# Patient Record
Sex: Male | Born: 1960 | Race: White | Hispanic: No | Marital: Married | Smoking: Never smoker
Health system: Southern US, Community
[De-identification: ages and names within clinical notes are randomized; demographics above are authoritative.]

## PROBLEM LIST (undated history)

## (undated) DIAGNOSIS — F419 Anxiety disorder, unspecified: Secondary | ICD-10-CM

## (undated) DIAGNOSIS — I10 Essential (primary) hypertension: Secondary | ICD-10-CM

## (undated) DIAGNOSIS — G4730 Sleep apnea, unspecified: Secondary | ICD-10-CM

## (undated) HISTORY — PX: WRIST FUSION: SHX839

## (undated) HISTORY — PX: JOINT REPLACEMENT: SHX530

---

## 2013-11-21 ENCOUNTER — Inpatient Hospital Stay (HOSPITAL_COMMUNITY)
Admission: EM | Admit: 2013-11-21 | Discharge: 2013-11-25 | DRG: 684 | Disposition: A | Discharge status: Home / Self Care | Payer: BC Managed Care – PPO | Attending: Internal Medicine | Admitting: Internal Medicine

## 2013-11-21 ENCOUNTER — Encounter (HOSPITAL_COMMUNITY): Payer: Self-pay | Admitting: Emergency Medicine

## 2013-11-21 DIAGNOSIS — F1020 Alcohol dependence, uncomplicated: Secondary | ICD-10-CM

## 2013-11-21 DIAGNOSIS — Z79899 Other long term (current) drug therapy: Secondary | ICD-10-CM

## 2013-11-21 DIAGNOSIS — F17200 Nicotine dependence, unspecified, uncomplicated: Secondary | ICD-10-CM | POA: Diagnosis present

## 2013-11-21 DIAGNOSIS — Z96659 Presence of unspecified artificial knee joint: Secondary | ICD-10-CM

## 2013-11-21 DIAGNOSIS — K5289 Other specified noninfective gastroenteritis and colitis: Secondary | ICD-10-CM | POA: Diagnosis present

## 2013-11-21 DIAGNOSIS — I10 Essential (primary) hypertension: Secondary | ICD-10-CM

## 2013-11-21 DIAGNOSIS — E860 Dehydration: Secondary | ICD-10-CM | POA: Diagnosis present

## 2013-11-21 DIAGNOSIS — F1320 Sedative, hypnotic or anxiolytic dependence, uncomplicated: Secondary | ICD-10-CM

## 2013-11-21 DIAGNOSIS — G4730 Sleep apnea, unspecified: Secondary | ICD-10-CM | POA: Diagnosis present

## 2013-11-21 DIAGNOSIS — N179 Acute kidney failure, unspecified: Principal | ICD-10-CM

## 2013-11-21 HISTORY — DX: Sleep apnea, unspecified: G47.30

## 2013-11-21 HISTORY — DX: Essential (primary) hypertension: I10

## 2013-11-21 HISTORY — DX: Anxiety disorder, unspecified: F41.9

## 2013-11-21 LAB — CBC
HCT: 37.7 % — ABNORMAL LOW (ref 39.0–52.0)
HEMOGLOBIN: 13.4 g/dL (ref 13.0–17.0)
MCH: 30.1 pg (ref 26.0–34.0)
MCHC: 35.5 g/dL (ref 30.0–36.0)
MCV: 84.7 fL (ref 78.0–100.0)
Platelets: 276 10*3/uL (ref 150–400)
RBC: 4.45 MIL/uL (ref 4.22–5.81)
RDW: 12.5 % (ref 11.5–15.5)
WBC: 10.2 10*3/uL (ref 4.0–10.5)

## 2013-11-21 LAB — URINALYSIS, ROUTINE W REFLEX MICROSCOPIC
Bilirubin Urine: NEGATIVE
GLUCOSE, UA: NEGATIVE mg/dL
Hgb urine dipstick: NEGATIVE
Ketones, ur: NEGATIVE mg/dL
LEUKOCYTES UA: NEGATIVE
Nitrite: NEGATIVE
PH: 5.5 (ref 5.0–8.0)
Protein, ur: NEGATIVE mg/dL
Specific Gravity, Urine: 1.011 (ref 1.005–1.030)
Urobilinogen, UA: 0.2 mg/dL (ref 0.0–1.0)

## 2013-11-21 LAB — BASIC METABOLIC PANEL
BUN: 47 mg/dL — ABNORMAL HIGH (ref 6–23)
CALCIUM: 9.2 mg/dL (ref 8.4–10.5)
CO2: 24 meq/L (ref 19–32)
Chloride: 96 mEq/L (ref 96–112)
Creatinine, Ser: 6.01 mg/dL — ABNORMAL HIGH (ref 0.50–1.35)
GFR calc Af Amer: 11 mL/min — ABNORMAL LOW (ref >90)
GFR calc non Af Amer: 10 mL/min — ABNORMAL LOW (ref >90)
GLUCOSE: 89 mg/dL (ref 70–99)
Potassium: 3.8 mEq/L (ref 3.7–5.3)
SODIUM: 139 meq/L (ref 137–147)

## 2013-11-21 MED ORDER — SODIUM CHLORIDE 0.9 % IV BOLUS (SEPSIS)
1000.0000 mL | Freq: Once | INTRAVENOUS | Status: AC
  Administered 2013-11-21: 1000 mL via INTRAVENOUS

## 2013-11-21 MED ORDER — SODIUM CHLORIDE 0.9 % IV SOLN
INTRAVENOUS | Status: AC
  Administered 2013-11-21: 1000 mL via INTRAVENOUS
  Administered 2013-11-22 – 2013-11-24 (×3): via INTRAVENOUS

## 2013-11-21 MED ORDER — TRAZODONE HCL 50 MG PO TABS
50.0000 mg | ORAL_TABLET | Freq: Every day | ORAL | Status: DC
  Administered 2013-11-21: 50 mg via ORAL
  Filled 2013-11-21 (×5): qty 1

## 2013-11-21 NOTE — H&P (Addendum)
Patient Demographics  Rick Wells, is a 53 y.o. male  MRN: [id number]   DOB - 06/30/1961  Admit Date - 11/21/2013  Outpatient Primary MD for the patient is No primary provider on file.  Assessment  Rick Wells a very pleasant 53 year old male with essential hypertension as well as Alcohol, Tobacco, Ambien and Benzodiazepine dependence, who comes in as a [facility] for an elevated creatinine level reportedly above 7. The high creatinine was confirmed in the ED,where it was found to be 6.01, with a BUN of 47. The cause of the apparent acute kidney injury is not known at this point. However, the patient gives history of acute abdominal pain in the epigastric area, which went on for about 3 days earlier this week. Patient also had diarrhea that started at the same time as the abdominal pain, with at least 2-3 bowel movements per day till yesterday. He has also been taking losartan for hypertension. His blood pressure was high in the ED. [facility] from Benzodiazepines and Ambien. He mentions that he started cutting back on the medications earlier in the week in preparation for the admission [facility]. He denies sick contacts or recreational drug use prior to this episode. The acute abdominal pain may be related to the acute kidney injury. The patient will be admitted for further workup. Will avoid  nephrotoxic medications including ARB/ACE inhibitor. Meanwhile, will give IV fluids, obtain CT abdomen and pelvis without IV contrast to try to identify a source of the acute abdominal pain, renal ultrasound to evaluate kidneys and check for hydronephrosis, and will also obtain urine electrolytes. Consider Nephrology for help with further assessment and management. Regarding detox, will defer management to [facility] once the acute medical issues have been addressed. Plan AKI (acute kidney injury)  Admit  telemetry  Urine electrolytes  Renal ultrasound  CT abdomen and pelvis  Avoid nephrotoxic medications  IV fluids  Monitor input and output  Consider nephrology consult depending on investigations and clinical progression. EtOH dependence/Benzodiazepine dependence/Tobacco dependence  Check for withdrawal  Supportive care as necessary  Folic acid  Thiamine  Urine toxicology screen  Consult clinical social worker Essential HTN (hypertension)   Discontinue ARB/diuretic  Lopressor  Hydralazine as needed  DVT/GI Prophylaxis   Heparin   PPI  AM Labs Ordered, also please review Full Orders  Family Communication: Admission, patients condition and plan of care including tests being ordered have been discussed with the patient who indicated understanding and agree with the plan and Code Status.  Code Status   Full Code  Likely DC to    [facility]  Condition GUARDED    HPI Rick Wells  is a 53 y.o. male, comes seen with abnormal lab tests. He had [facility] as routine lab investigation and his creatinine was reportedly greater than 7 when it was normal 3 months ago. He was otherwise in good health today. He reports that he had acute abdominal pain about 5 days call associated with diarrhea 2-3 times a day. He has continued to take his blood pressure medications. He denies fever, nausea, cough or dysuria.  Review of Systems As in the HPI above.   Past Medical History  Diagnosis Date  . Hypertension       Past Surgical History  Procedure Laterality Date  . Wrist fusion    . Joint replacement  knee left    in for   Chief Complaint  Patient presents with  . Abnormal Lab      Social History History  Substance Use Topics  . Smoking status: Never Smoker   . Smokeless tobacco: Not on file  . Alcohol Use: Yes   Family History Mother has heart disease and father is diabetic.  Prior to Admission  medications   Medication Sig Start Date End Date Taking? Authorizing Provider  chlordiazePOXIDE (LIBRIUM) 25 MG capsule Take 25 mg by mouth 4 (four) times daily.   Yes Historical Provider, MD  losartan-hydrochlorothiazide (HYZAAR) 100-12.5 MG per tablet Take 1 tablet by mouth daily.   Yes Historical Provider, MD  Multiple Vitamin (MULTIVITAMIN WITH MINERALS) TABS tablet Take 1 tablet by mouth daily.   Yes Historical Provider, MD  nicotine polacrilex (NICORETTE) 4 MG gum Take 4 mg by mouth 2 (two) times daily.   Yes Historical Provider, MD  Omega-3 Fatty Acids (FISH OIL) 1200 MG CAPS Take 1,200 mg by mouth 2 (two) times daily.   Yes Historical Provider, MD  PSYLLIUM HUSK PO Take 1,000 mg by mouth 2 (two) times daily.   Yes Historical Provider, MD  thiamine (VITAMIN B-1) 100 MG tablet Take 100 mg by mouth daily.   Yes Historical Provider, MD  traZODone (DESYREL) 50 MG tablet Take 50 mg by mouth at bedtime.   Yes Historical Provider, MD    No Known Allergies  Physical Exam  Vitals  Blood pressure 174/111, pulse 84, temperature 99 F (37.2 C), temperature source Oral, resp. rate 16, height 5\' 10"  (1.778 m), weight 90.719 kg (200 lb), SpO2 99.00%.   1. General lying in bed in NAD.  2. Normal affect and insight, Not Suicidal or Homicidal, Awake Alert, Oriented X 3.  3. No F.N deficits, ALL C.Nerves Intact, Strength 5/5 all 4 extremities, Sensation intact all 4 extremities, Plantars down going.  4. Ears and Eyes appear Normal, Conjunctivae clear, PERRLA. Moist Oral Mucosa.  5. Supple Neck, No JVD, No cervical lymphadenopathy appriciated, No Carotid Bruits.  6. Symmetrical Chest wall movement, Good air movement bilaterally, CTAB.  7. RRR, No Gallops, Rubs or Murmurs, No Parasternal Heave.  8. Positive Bowel Sounds, Abdomen Soft, Non tender, No organomegaly appriciated,No rebound -guarding or rigidity.  9.  No Cyanosis, Normal Skin Turgor, No Skin Rash or Bruise.  10. Good muscle tone,   joints appear normal , no effusions, Normal ROM.  11. No Palpable Lymph Nodes in Neck or Axillae  Data Review  CBC  Recent Labs Lab 11/21/13 2040  WBC 10.2  HGB 13.4  HCT 37.7*  PLT 276  MCV 84.7  MCH 30.1  MCHC 35.5  RDW 12.5   ------------------------------------------------------------------------------------------------------------------  Chemistries   Recent Labs Lab 11/21/13 2040  NA 139  K 3.8  CL 96  CO2 24  GLUCOSE 89  BUN 47*  CREATININE 6.01*  CALCIUM 9.2   ------------------------------------------------------------------------------------------------------------------ estimated creatinine clearance is 16.3 ml/min (by C-G formula based on Cr of 6.01). ------------------------------------------------------------------------------------------------------------------ No results found for this basename: TSH, T4TOTAL, FREET3, T3FREE, THYROIDAB,  in the last 72 hours   Coagulation profile No results found for this basename: INR, PROTIME,  in the last 168 hours ------------------------------------------------------------------------------------------------------------------- No results found for this basename: DDIMER,  in the last 72 hours -------------------------------------------------------------------------------------------------------------------  Cardiac Enzymes No results found for this basename: CK, CKMB, TROPONINI, MYOGLOBIN,  in the last 168 hours ------------------------------------------------------------------------------------------------------------------ No components found with this basename: POCBNP,    ---------------------------------------------------------------------------------------------------------------  Urinalysis    Component Value Date/Time   COLORURINE YELLOW 11/21/2013 2043   APPEARANCEUR CLEAR 11/21/2013 2043   LABSPEC 1.011 11/21/2013 2043   PHURINE 5.5 11/21/2013 2043   GLUCOSEU NEGATIVE 11/21/2013 2043   HGBUR NEGATIVE  11/21/2013 2043   BILIRUBINUR NEGATIVE 11/21/2013 2043   KETONESUR NEGATIVE 11/21/2013 2043   PROTEINUR NEGATIVE 11/21/2013 2043   UROBILINOGEN 0.2 11/21/2013 2043   NITRITE NEGATIVE 11/21/2013 2043   LEUKOCYTESUR NEGATIVE 11/21/2013 2043    ----------------------------------------------------------------------------------------------------------------  Imaging results:   No results found.      Jolinda Pinkstaff M.D on 11/21/2013 at 11:26 PM  Between 7am to 7pm - Pager - [phone]  After 7pm go to [email or web address] - password [email or web address]  And look for the night coverage person covering me after hours  [facility] Office  [phone]

## 2013-11-21 NOTE — ED Notes (Signed)
[facility]

## 2013-11-21 NOTE — ED Notes (Signed)
Admitting MD at bedside.

## 2013-11-21 NOTE — ED Provider Notes (Signed)
TIME SEEN: 8:30 PM  CHIEF COMPLAINT: Abnormal labs, acute renal failure  HPI: Patient is a 53 year old male with history of alcohol and benzodiazepine abuse, hypertension on losartan hydrochlorothiazide he presents emergency department [facility] with abnormal labs drawn today. Patient reports he he had routine labs drawn today and was found to have a creatinine of 7.71. He denies any changes in his medications. Denies any vomiting or diarrhea. He has been eating and drinking well. He states he last had blood drawn by his [facility] 3 months ago that were normal. He states he's never had a history of kidney failure or ever been told he needed dialysis in the future. He denies any flank pain. No difficulty urinating. No fevers. No chest pain or shortness of breath.  ROS: See HPI Constitutional: no fever  Eyes: no drainage  ENT: no runny nose   Cardiovascular:  no chest pain  Resp: no SOB  GI: no vomiting GU: no dysuria Integumentary: no rash  Allergy: no hives  Musculoskeletal: no leg swelling  Neurological: no slurred speech ROS otherwise negative  PAST MEDICAL HISTORY/PAST SURGICAL HISTORY:  Past Medical History  Diagnosis Date  . Hypertension     MEDICATIONS:  Prior to Admission medications   Medication Sig Start Date End Date Taking? Authorizing Provider  chlordiazePOXIDE (LIBRIUM) 25 MG capsule Take 25 mg by mouth 4 (four) times daily.   Yes Historical Provider, MD  losartan-hydrochlorothiazide (HYZAAR) 100-12.5 MG per tablet Take 1 tablet by mouth daily.   Yes Historical Provider, MD  Multiple Vitamin (MULTIVITAMIN WITH MINERALS) TABS tablet Take 1 tablet by mouth daily.   Yes Historical Provider, MD  nicotine polacrilex (NICORETTE) 4 MG gum Take 4 mg by mouth 2 (two) times daily.   Yes Historical Provider, MD  Omega-3 Fatty Acids (FISH OIL) 1200 MG CAPS Take 1,200 mg by mouth 2 (two) times daily.   Yes  Historical Provider, MD  PSYLLIUM HUSK PO Take 1,000 mg by mouth 2 (two) times daily.   Yes Historical Provider, MD  thiamine (VITAMIN B-1) 100 MG tablet Take 100 mg by mouth daily.   Yes Historical Provider, MD  traZODone (DESYREL) 50 MG tablet Take 50 mg by mouth at bedtime.   Yes Historical Provider, MD    ALLERGIES:  No Known Allergies  SOCIAL HISTORY:  History  Substance Use Topics  . Smoking status: Never Smoker   . Smokeless tobacco: Not on file  . Alcohol Use: Yes    FAMILY HISTORY: No family history on file.  EXAM: BP 154/101  Pulse 72  Temp(Src) 99 F (37.2 C) (Oral)  Resp 16  Ht 5\' 10"  (1.778 m)  Wt 200 lb (90.719 kg)  BMI 28.70 kg/m2  SpO2 97% CONSTITUTIONAL: Alert and oriented and responds appropriately to questions. Well-appearing; well-nourished HEAD: Normocephalic EYES: Conjunctivae clear, PERRL ENT: normal nose; no rhinorrhea; moist mucous membranes; pharynx without lesions noted NECK: Supple, no meningismus, no LAD  CARD: RRR; S1 and S2 appreciated; no murmurs, no clicks, no rubs, no gallops RESP: Normal chest excursion without splinting or tachypnea; breath sounds clear and equal bilaterally; no wheezes, no rhonchi, no rales,  ABD/GI: Normal bowel sounds; non-distended; soft, non-tender, no rebound, no guarding BACK:  The back appears normal and is non-tender to palpation, there is no CVA tenderness EXT: Normal ROM in all joints; non-tender to palpation; no edema; normal capillary refill; no cyanosis    SKIN: Normal color  for age and race; warm NEURO: Moves all extremities equally PSYCH: The patient's mood and manner are appropriate. Grooming and personal hygiene are appropriate.  MEDICAL DECISION MAKING: Patient here with acute renal failure. His blood work here confirms a creatinine of 6.01. He has normal electrolytes. EKG shows no changes. Discussed with hospitalist, Dr. Venetia Constable, for admission. Suspect his acute renal failure may be prerenal in nature.  We are giving IV fluids.     EKG Interpretation  Date/Time:  Saturday November 21 2013 20:55:45 EDT Ventricular Rate:  75 PR Interval:  142 QRS Duration: 95 QT Interval:  382 QTC Calculation: 427 R Axis:   108 Text Interpretation:  Right and left arm electrode reversal, interpretation assumes no reversal Sinus rhythm Probable right ventricular hypertrophy Probable lateral infarct, old Confirmed by Akaylah Lalley,  DO, Clarice Zulauf ([id number]) on 11/21/2013 9:29:51 PM        Layla Maw Thad Osoria, DO 11/21/13 2317

## 2013-11-21 NOTE — ED Notes (Signed)
Ekg given to Dr. Elesa MassedWard

## 2013-11-21 NOTE — ED Notes (Signed)
Pt was sent here with abnormal elevated BUN 45 and Creat 7.71.  Sent to check for acute renal failure.  [facility]  No SI/HI

## 2013-11-22 ENCOUNTER — Inpatient Hospital Stay (HOSPITAL_COMMUNITY): Payer: BC Managed Care – PPO

## 2013-11-22 ENCOUNTER — Encounter (HOSPITAL_COMMUNITY): Payer: Self-pay | Admitting: *Deleted

## 2013-11-22 DIAGNOSIS — F1020 Alcohol dependence, uncomplicated: Secondary | ICD-10-CM

## 2013-11-22 DIAGNOSIS — I10 Essential (primary) hypertension: Secondary | ICD-10-CM

## 2013-11-22 DIAGNOSIS — F1320 Sedative, hypnotic or anxiolytic dependence, uncomplicated: Secondary | ICD-10-CM

## 2013-11-22 LAB — COMPREHENSIVE METABOLIC PANEL
ALBUMIN: 3 g/dL — AB (ref 3.5–5.2)
ALK PHOS: 55 U/L (ref 39–117)
ALT: 15 U/L (ref 0–53)
AST: 13 U/L (ref 0–37)
BILIRUBIN TOTAL: 0.4 mg/dL (ref 0.3–1.2)
BUN: 43 mg/dL — AB (ref 6–23)
CHLORIDE: 103 meq/L (ref 96–112)
CO2: 22 mEq/L (ref 19–32)
Calcium: 8 mg/dL — ABNORMAL LOW (ref 8.4–10.5)
Creatinine, Ser: 4.92 mg/dL — ABNORMAL HIGH (ref 0.50–1.35)
GFR calc non Af Amer: 12 mL/min — ABNORMAL LOW (ref >90)
GFR, EST AFRICAN AMERICAN: 14 mL/min — AB (ref >90)
GLUCOSE: 103 mg/dL — AB (ref 70–99)
POTASSIUM: 3.6 meq/L — AB (ref 3.7–5.3)
SODIUM: 140 meq/L (ref 137–147)
Total Protein: 6.2 g/dL (ref 6.0–8.3)

## 2013-11-22 LAB — CBC WITH DIFFERENTIAL/PLATELET
Basophils Absolute: 0 10*3/uL (ref 0.0–0.1)
Basophils Relative: 0 % (ref 0–1)
Eosinophils Absolute: 0.2 10*3/uL (ref 0.0–0.7)
Eosinophils Relative: 2 % (ref 0–5)
HCT: 35.4 % — ABNORMAL LOW (ref 39.0–52.0)
HEMOGLOBIN: 12.4 g/dL — AB (ref 13.0–17.0)
LYMPHS ABS: 1.5 10*3/uL (ref 0.7–4.0)
Lymphocytes Relative: 19 % (ref 12–46)
MCH: 29.9 pg (ref 26.0–34.0)
MCHC: 35 g/dL (ref 30.0–36.0)
MCV: 85.3 fL (ref 78.0–100.0)
MONOS PCT: 9 % (ref 3–12)
Monocytes Absolute: 0.7 10*3/uL (ref 0.1–1.0)
Neutro Abs: 5.3 10*3/uL (ref 1.7–7.7)
Neutrophils Relative %: 70 % (ref 43–77)
Platelets: 228 10*3/uL (ref 150–400)
RBC: 4.15 MIL/uL — AB (ref 4.22–5.81)
RDW: 12.7 % (ref 11.5–15.5)
WBC: 7.8 10*3/uL (ref 4.0–10.5)

## 2013-11-22 LAB — SODIUM, URINE, RANDOM: SODIUM UR: 78 meq/L

## 2013-11-22 LAB — CALCIUM, URINE, RANDOM: Calcium, Ur: 1 mg/dL

## 2013-11-22 LAB — LIPASE, BLOOD: Lipase: 47 U/L (ref 11–59)

## 2013-11-22 LAB — PHOSPHORUS: Phosphorus: 4.8 mg/dL — ABNORMAL HIGH (ref 2.3–4.6)

## 2013-11-22 LAB — CREATININE, URINE, RANDOM: Creatinine, Urine: 45.89 mg/dL

## 2013-11-22 LAB — CREATININE, SERUM
Creatinine, Ser: 4.88 mg/dL — ABNORMAL HIGH (ref 0.50–1.35)
GFR calc Af Amer: 14 mL/min — ABNORMAL LOW (ref >90)
GFR calc non Af Amer: 12 mL/min — ABNORMAL LOW (ref >90)

## 2013-11-22 LAB — PROTIME-INR
INR: 1.06 (ref 0.00–1.49)
Prothrombin Time: 13.6 seconds (ref 11.6–15.2)

## 2013-11-22 LAB — PROTEIN, URINE, RANDOM: Total Protein, Urine: <4 mg/dL

## 2013-11-22 LAB — MAGNESIUM: Magnesium: 1.5 mg/dL (ref 1.5–2.5)

## 2013-11-22 LAB — TSH: TSH: 2.55 u[IU]/mL (ref 0.350–4.500)

## 2013-11-22 LAB — LACTATE DEHYDROGENASE: LDH: 212 U/L (ref 94–250)

## 2013-11-22 LAB — AMYLASE: Amylase: 72 U/L (ref 0–105)

## 2013-11-22 LAB — APTT: aPTT: 32 seconds (ref 24–37)

## 2013-11-22 MED ORDER — PANTOPRAZOLE SODIUM 40 MG PO TBEC
40.0000 mg | DELAYED_RELEASE_TABLET | Freq: Every day | ORAL | Status: DC
  Administered 2013-11-22 – 2013-11-25 (×4): 40 mg via ORAL
  Filled 2013-11-22 (×4): qty 1

## 2013-11-22 MED ORDER — TEMAZEPAM 15 MG PO CAPS
15.0000 mg | ORAL_CAPSULE | Freq: Every evening | ORAL | Status: DC | PRN
  Administered 2013-11-22 – 2013-11-24 (×3): 15 mg via ORAL
  Filled 2013-11-22 (×3): qty 1

## 2013-11-22 MED ORDER — SODIUM CHLORIDE 0.9 % IJ SOLN
3.0000 mL | Freq: Two times a day (BID) | INTRAMUSCULAR | Status: DC
Start: 2013-11-22 — End: 2013-11-25
  Administered 2013-11-22 – 2013-11-25 (×5): 3 mL via INTRAVENOUS

## 2013-11-22 MED ORDER — ACETAMINOPHEN 650 MG RE SUPP: 650.0000 mg | Freq: Four times a day (QID) | RECTAL | Status: DC | PRN

## 2013-11-22 MED ORDER — FOLIC ACID 1 MG PO TABS
1.0000 mg | ORAL_TABLET | Freq: Every day | ORAL | Status: DC
  Administered 2013-11-22 – 2013-11-25 (×4): 1 mg via ORAL
  Filled 2013-11-22 (×4): qty 1

## 2013-11-22 MED ORDER — IOHEXOL 300 MG/ML  SOLN
25.0000 mL | INTRAMUSCULAR | Status: AC
  Administered 2013-11-22 (×2): 25 mL via ORAL

## 2013-11-22 MED ORDER — NICOTINE POLACRILEX 2 MG MT GUM
4.0000 mg | CHEWING_GUM | Freq: Two times a day (BID) | OROMUCOSAL | Status: DC
  Administered 2013-11-22: 4 mg via ORAL
  Filled 2013-11-22 (×9): qty 2

## 2013-11-22 MED ORDER — LORAZEPAM 2 MG/ML IJ SOLN
2.0000 mg | Freq: Four times a day (QID) | INTRAMUSCULAR | Status: DC | PRN
  Administered 2013-11-22 – 2013-11-24 (×10): 2 mg via INTRAVENOUS
  Filled 2013-11-22 (×9): qty 1

## 2013-11-22 MED ORDER — VITAMIN B-1 100 MG PO TABS: 100.0000 mg | ORAL_TABLET | Freq: Every day | ORAL | Status: DC

## 2013-11-22 MED ORDER — VITAMIN B-1 100 MG PO TABS
100.0000 mg | ORAL_TABLET | Freq: Every day | ORAL | Status: DC
  Administered 2013-11-22 – 2013-11-25 (×4): 100 mg via ORAL
  Filled 2013-11-22 (×4): qty 1

## 2013-11-22 MED ORDER — ONDANSETRON HCL 4 MG PO TABS
4.0000 mg | ORAL_TABLET | Freq: Four times a day (QID) | ORAL | Status: DC | PRN
  Administered 2013-11-22 (×2): 4 mg via ORAL
  Filled 2013-11-22 (×2): qty 1

## 2013-11-22 MED ORDER — HEPARIN SODIUM (PORCINE) 5000 UNIT/ML IJ SOLN
5000.0000 [IU] | Freq: Three times a day (TID) | INTRAMUSCULAR | Status: DC
  Administered 2013-11-22 – 2013-11-25 (×7): 5000 [IU] via SUBCUTANEOUS
  Filled 2013-11-22 (×12): qty 1

## 2013-11-22 MED ORDER — HYDRALAZINE HCL 20 MG/ML IJ SOLN: 10.0000 mg | Freq: Three times a day (TID) | INTRAMUSCULAR | Status: DC | PRN

## 2013-11-22 MED ORDER — ACETAMINOPHEN 325 MG PO TABS
650.0000 mg | ORAL_TABLET | Freq: Four times a day (QID) | ORAL | Status: DC | PRN
Start: 2013-11-22 — End: 2013-11-25

## 2013-11-22 MED ORDER — CIPROFLOXACIN IN D5W 400 MG/200ML IV SOLN
400.0000 mg | INTRAVENOUS | Status: DC
  Administered 2013-11-22 – 2013-11-23 (×2): 400 mg via INTRAVENOUS
  Filled 2013-11-22 (×3): qty 200

## 2013-11-22 MED ORDER — METRONIDAZOLE IN NACL 5-0.79 MG/ML-% IV SOLN
500.0000 mg | Freq: Three times a day (TID) | INTRAVENOUS | Status: DC
Start: 2013-11-22 — End: 2013-11-24
  Administered 2013-11-22 – 2013-11-23 (×4): 500 mg via INTRAVENOUS
  Filled 2013-11-22 (×8): qty 100

## 2013-11-22 MED ORDER — METOPROLOL TARTRATE 25 MG PO TABS
25.0000 mg | ORAL_TABLET | Freq: Two times a day (BID) | ORAL | Status: DC
  Administered 2013-11-22 – 2013-11-25 (×8): 25 mg via ORAL
  Filled 2013-11-22 (×9): qty 1

## 2013-11-22 MED ORDER — ONDANSETRON HCL 4 MG/2ML IJ SOLN
4.0000 mg | Freq: Four times a day (QID) | INTRAMUSCULAR | Status: DC | PRN
  Administered 2013-11-22: 4 mg via INTRAVENOUS
  Filled 2013-11-22: qty 2

## 2013-11-22 MED ORDER — ZOLPIDEM TARTRATE 5 MG PO TABS
5.0000 mg | ORAL_TABLET | Freq: Every evening | ORAL | Status: DC | PRN
  Administered 2013-11-22: 5 mg via ORAL

## 2013-11-22 MED ORDER — CHLORDIAZEPOXIDE HCL 25 MG PO CAPS
25.0000 mg | ORAL_CAPSULE | Freq: Three times a day (TID) | ORAL | Status: DC
  Administered 2013-11-22 – 2013-11-25 (×14): 25 mg via ORAL
  Filled 2013-11-22 (×14): qty 1

## 2013-11-22 MED ORDER — SENNOSIDES-DOCUSATE SODIUM 8.6-50 MG PO TABS: 1.0000 | ORAL_TABLET | Freq: Every evening | ORAL | Status: DC | PRN

## 2013-11-22 MED ORDER — ASPIRIN EC 81 MG PO TBEC
81.0000 mg | DELAYED_RELEASE_TABLET | Freq: Every day | ORAL | Status: DC
  Administered 2013-11-22 – 2013-11-25 (×4): 81 mg via ORAL
  Filled 2013-11-22 (×4): qty 1

## 2013-11-22 MED ORDER — ADULT MULTIVITAMIN W/MINERALS CH
1.0000 | ORAL_TABLET | Freq: Every day | ORAL | Status: DC
  Administered 2013-11-22 – 2013-11-25 (×4): 1 via ORAL
  Filled 2013-11-22 (×4): qty 1

## 2013-11-22 MED ORDER — TRAMADOL HCL 50 MG PO TABS: 50.0000 mg | ORAL_TABLET | Freq: Four times a day (QID) | ORAL | Status: DC | PRN

## 2013-11-22 NOTE — Progress Notes (Signed)
[facility] PROGRESS NOTE  Rick Wells ZOX:[id number] DOB: 1961/08/03 DOA: 11/21/2013 PCP: No primary provider on file.  Assessment/Plan: Present on Admission:  . AKI (acute kidney injury) -dehydration in setting of ARB use, contributing factors -improving off ARB and on IVF -await renal US, continue hydration with IVF -follow consult renal pending [place] and clinical course . EtOH dependence/benzodiazepine dependence/tobacco abuse -continue ativan prn, MVI thiamine and folic acid . HTN (hypertension) -continue metoprolol and prn hydralazine . Colitis, mild -per CT abd, will start on empiric cipro and flagyl -improving, follow    Code Status:full Family Communication: none at bedside Disposition Plan: to [facility] when [facility] ready   Consultants:  None  Procedures:  none  Antibiotics:  FLAGYL-started 4/5  CIPRO-started 4/5  HPI/Subjective: Denies n/v, and states he has had no further abd pain, no diarrhea  Objective: Filed Vitals:   11/22/13 1300  BP: 143/82  Pulse: 64  Temp: 98.6 F (37 C)  Resp: 18    Intake/Output Summary (Last 24 hours) at 11/22/13 1629 Last data filed at 11/22/13 1300  Gross per 24 hour  Intake      0 ml  Output   1125 ml  Net  -1125 ml   Filed Weights   11/21/13 1959 11/22/13 0049  Weight: 90.719 kg (200 lb) 96.843 kg (213 lb 8 oz)    Exam:  General: alert & oriented x 3 In NAD Cardiovascular: RRR, nl S1 s2 Respiratory: CTAB Abdomen: soft +BS NT/ND, no masses palpable Extremities: No cyanosis and no edema    Data Reviewed: Basic Metabolic Panel:  Recent Labs Lab 11/21/13 2040 11/22/13 0655  NA 139 140  K 3.8 3.6*  CL 96 103  CO2 24 22  GLUCOSE 89 103*  BUN 47* 43*  CREATININE 6.01* 4.88*  4.92*  CALCIUM 9.2 8.0*  MG  --  1.5  PHOS  --  4.8*   Liver Function Tests:  Recent Labs Lab 11/22/13 0655  AST 13  ALT 15  ALKPHOS 55  BILITOT 0.4  PROT 6.2  ALBUMIN 3.0*    Recent  Labs Lab 11/22/13 0655  LIPASE 47  AMYLASE 72   No results found for this basename: AMMONIA,  in the last 168 hours CBC:  Recent Labs Lab 11/21/13 2040 11/22/13 0655  WBC 10.2 7.8  NEUTROABS  --  5.3  HGB 13.4 12.4*  HCT 37.7* 35.4*  MCV 84.7 85.3  PLT 276 228   Cardiac Enzymes: No results found for this basename: CKTOTAL, CKMB, CKMBINDEX, TROPONINI,  in the last 168 hours BNP (last 3 results) No results found for this basename: PROBNP,  in the last 8760 hours CBG: No results found for this basename: GLUCAP,  in the last 168 hours  No results found for this or any previous visit (from the past 240 hour(s)).   Studies: Ct Abdomen Pelvis Wo Contrast  11/22/2013   CLINICAL DATA:  Acute abdominal pain and acute renal failure. Recent diarrhea.  EXAM: CT ABDOMEN AND PELVIS WITHOUT CONTRAST  TECHNIQUE: Multidetector CT imaging of the abdomen and pelvis was performed following the standard protocol without intravenous contrast.  COMPARISON:  None.  FINDINGS: There is edema in the mucosa of the distal descending and proximal sigmoid portion of the colon without pericolonic inflammation. There are numerous diverticula in that area.  The bowel is otherwise normal including the terminal ileum and appendix.  The unenhanced liver, biliary tree, spleen, pancreas, and adrenal glands are normal. There is a 2.2 cm  exophytic cyst on the upper pole of the left kidney and a 4 mm cyst on the lateral aspect of the mid portion of the left kidney. Kidneys otherwise appear normal. No hydronephrosis. No ureteral or renal calculi. Bladder is normal. No free air or free fluid. No acute osseous abnormalities.  IMPRESSION: Focal edema in the mucosa of the distal colon consistent with mild colitis. Otherwise benign appearing abdomen.   Electronically Signed   By: Geanie CooleyJim  Maxwell M.D.   On: 11/22/2013 11:14   Dg Chest 2 View  11/22/2013   CLINICAL DATA:  aki  EXAM: CHEST  2 VIEW  COMPARISON:  None.  FINDINGS: The heart  size and mediastinal contours are within normal limits. Both lungs are clear. The visualized skeletal structures are unremarkable.  IMPRESSION: No active cardiopulmonary disease.   Electronically Signed   By: Salome HolmesHector  Cooper M.D.   On: 11/22/2013 09:00   [place] Abdomen Complete  11/22/2013   CLINICAL DATA:  Abdominal pain  EXAM: ULTRASOUND ABDOMEN COMPLETE  COMPARISON:  CT scan same day  FINDINGS: Gallbladder:  No gallstones or wall thickening visualized. No sonographic Murphy sign noted.  Common bile duct:  Diameter: 4 mm  Liver:  No focal lesion identified. Within normal limits in parenchymal echogenicity.  IVC:  No abnormality visualized.  Pancreas:  Visualized portion unremarkable.  Spleen:  Size and appearance within normal limits.  Measures 8.5 cm in length  Right Kidney:  Length: 11.3 cm. Echogenicity within normal limits. No mass or hydronephrosis visualized.  Left Kidney:  Length: 12.7 cm. No hydronephrosis or diagnostic renal calculus. Hypoechoic cyst in upper pole measures 2.2 x 2.4 cm.  Abdominal aorta:  No aneurysm visualized.  Measures up to 2.3 cm in diameter.  Other findings:  None.  IMPRESSION: Unremarkable abdominal ultrasound. No hydronephrosis or diagnostic renal calculus. Left renal cyst again noted.   Electronically Signed   By: Natasha MeadLiviu  Pop M.D.   On: 11/22/2013 15:26    Scheduled Meds: . aspirin EC  81 mg Oral Daily  . chlordiazePOXIDE  25 mg Oral TID AC & HS  . ciprofloxacin  400 mg Intravenous Q24H  . folic acid  1 mg Oral Daily  . heparin  5,000 Units Subcutaneous 3 times per day  . metoprolol tartrate  25 mg Oral BID  . metronidazole  500 mg Intravenous Q8H  . multivitamin with minerals  1 tablet Oral Daily  . nicotine polacrilex  4 mg Oral BID  . pantoprazole  40 mg Oral Q1200  . sodium chloride  3 mL Intravenous Q12H  . thiamine  100 mg Oral Daily  . traZODone  50 mg Oral QHS   Continuous Infusions: . sodium chloride 1,000 mL (11/21/13 2308)    Active Problems:   AKI  (acute kidney injury)   EtOH dependence   HTN (hypertension)   Benzodiazepine dependence    Time spent: 35    Antonia Culbertson C  [facility] Pager [phone]. If 7PM-7AM, please contact night-coverage at [email or web address], password [email or web address] 11/22/2013, 4:29 PM  LOS: 1 day

## 2013-11-22 NOTE — Consult Note (Signed)
ANTIBIOTIC CONSULT NOTE - INITIAL  Pharmacy Consult for Ciproofloxacin Indication: colitis  No Known Allergies  Patient Measurements: Height: 5\' 10"  (177.8 cm) Weight: 213 lb 8 oz (96.843 kg) IBW/kg (Calculated) : 73  Vital Signs: Temp: 97.8 F (36.6 C) (04/05 0900) Temp src: Oral (04/05 0900) BP: 127/81 mmHg (04/05 0900) Pulse Rate: 64 (04/05 0900) Intake/Output from previous day: 04/04 0701 - 04/05 0700 In: -  Out: 825 [Urine:825] Intake/Output from this shift: Total I/O In: 0  Out: 300 [Urine:300]  Labs:  Recent Labs  11/21/13 2040 11/22/13 0308 11/22/13 0655  WBC 10.2  --  7.8  HGB 13.4  --  12.4*  PLT 276  --  228  LABCREA  --  45.89  --   CREATININE 6.01*  --  4.88*  4.92*   Estimated Creatinine Clearance: 20.7 ml/min (by C-G formula based on Cr of 4.88). No results found for this basename: VANCOTROUGH, VANCOPEAK, VANCORANDOM, GENTTROUGH, GENTPEAK, GENTRANDOM, TOBRATROUGH, TOBRAPEAK, TOBRARND, AMIKACINPEAK, AMIKACINTROU, AMIKACIN,  in the last 72 hours   Microbiology: No results found for this or any previous visit (from the past 720 hour(s)).  Medical History: Past Medical History  Diagnosis Date  . Hypertension   . Sleep apnea   . Anxiety     Medications:  Scheduled:  . aspirin EC  81 mg Oral Daily  . chlordiazePOXIDE  25 mg Oral TID AC & HS  . ciprofloxacin  400 mg Intravenous Q24H  . folic acid  1 mg Oral Daily  . heparin  5,000 Units Subcutaneous 3 times per day  . metoprolol tartrate  25 mg Oral BID  . metronidazole  500 mg Intravenous Q8H  . multivitamin with minerals  1 tablet Oral Daily  . nicotine polacrilex  4 mg Oral BID  . pantoprazole  40 mg Oral Q1200  . sodium chloride  3 mL Intravenous Q12H  . thiamine  100 mg Oral Daily  . traZODone  50 mg Oral QHS   Assessment: [name] is a 53 yo M admitted with acute kidney injury, cause unknown. He also complains of epigastric abdominal pain, and pharmacy has been consulted to dose  ciprofloxacin for colitis. He was also started on Flagyl, managed by the MD.   His WBC are wnl and he remains afebrile.   cipro 4/5 >> Flagyl 4/5 >>  4/5 urine >> sent  Goal of Therapy:  Proper antibiotic dosing Eradication of infection  Plan:  - Start Cipro 400 mg IV q 24 hours. - Change to PO formulation when able - Follow renal function, cultures, clinical improvement  Orilla Templeman C. Lanina Larranaga, PharmD Clinical Pharmacist-Resident Pager: [phone] Pharmacy: [phone] 11/22/2013 11:50 AM

## 2013-11-23 LAB — CBC
HCT: 36.8 % — ABNORMAL LOW (ref 39.0–52.0)
Hemoglobin: 12.6 g/dL — ABNORMAL LOW (ref 13.0–17.0)
MCH: 29.6 pg (ref 26.0–34.0)
MCHC: 34.2 g/dL (ref 30.0–36.0)
MCV: 86.4 fL (ref 78.0–100.0)
PLATELETS: 240 10*3/uL (ref 150–400)
RBC: 4.26 MIL/uL (ref 4.22–5.81)
RDW: 12.9 % (ref 11.5–15.5)
WBC: 8.4 10*3/uL (ref 4.0–10.5)

## 2013-11-23 LAB — BASIC METABOLIC PANEL
BUN: 34 mg/dL — ABNORMAL HIGH (ref 6–23)
CHLORIDE: 105 meq/L (ref 96–112)
CO2: 23 mEq/L (ref 19–32)
CREATININE: 3.75 mg/dL — AB (ref 0.50–1.35)
Calcium: 8.2 mg/dL — ABNORMAL LOW (ref 8.4–10.5)
GFR, EST AFRICAN AMERICAN: 20 mL/min — AB (ref >90)
GFR, EST NON AFRICAN AMERICAN: 17 mL/min — AB (ref >90)
Glucose, Bld: 91 mg/dL (ref 70–99)
POTASSIUM: 4.4 meq/L (ref 3.7–5.3)
SODIUM: 140 meq/L (ref 137–147)

## 2013-11-23 LAB — URINE CULTURE
COLONY COUNT: NO GROWTH
CULTURE: NO GROWTH

## 2013-11-23 NOTE — Progress Notes (Signed)
[facility] PROGRESS NOTE  Haynes Giannotti ZOX:[id number] DOB: 1960/12/20 DOA: 11/21/2013 PCP: No primary provider on file.  Assessment/Plan: Present on Admission:  . AKI (acute kidney injury) -dehydration in setting of ARB use, contributing factors -improving off ARB and on IVF -Renal ultrasound with left kidney upper pole cyst, otherwise negative -Creatinine down to 3.75 this a.m., will continue hydration and follow  . EtOH dependence/benzodiazepine dependence/tobacco abuse -continue ativan prn, MVI thiamine and folic acid . HTN (hypertension) -continue metoprolol and prn hydralazine . Colitis, mild -per CT abd, will change to by mouth antibiotics and the a.m. -improving, follow    Code Status:full Family Communication: none at bedside Disposition Plan: to home/[facility] when medically ready   Consultants:  None  Procedures:  none  Antibiotics:  FLAGYL-started 4/5  CIPRO-started 4/5  HPI/Subjective: Patient states he feels much better today, reports he had some loose stools yesterday after her contrast but has not had any further diarrhea today. He's tolerating by mouth was well. He reports he slept much better last PM.  Objective: Filed Vitals:   11/23/13 1440  BP: 130/77  Pulse: 97  Temp: 98.1 F (36.7 C)  Resp: 18    Intake/Output Summary (Last 24 hours) at 11/23/13 1709 Last data filed at 11/23/13 1345  Gross per 24 hour  Intake    120 ml  Output   1826 ml  Net  -1706 ml   Filed Weights   11/21/13 1959 11/22/13 0049 11/22/13 2100  Weight: 90.719 kg (200 lb) 96.843 kg (213 lb 8 oz) 96.435 kg (212 lb 9.6 oz)    Exam:  General: alert & oriented x 3 In NAD Cardiovascular: RRR, nl S1 s2 Respiratory: CTAB Abdomen: soft +BS NT/ND, no masses palpable Extremities: No cyanosis and no edema    Data Reviewed: Basic Metabolic Panel:  Recent Labs Lab 11/21/13 2040 11/22/13 0655 11/23/13 0445  NA 139 140 140  K 3.8 3.6* 4.4  CL 96 103  105  CO2 24 22 23   GLUCOSE 89 103* 91  BUN 47* 43* 34*  CREATININE 6.01* 4.88*  4.92* 3.75*  CALCIUM 9.2 8.0* 8.2*  MG  --  1.5  --   PHOS  --  4.8*  --    Liver Function Tests:  Recent Labs Lab 11/22/13 0655  AST 13  ALT 15  ALKPHOS 55  BILITOT 0.4  PROT 6.2  ALBUMIN 3.0*    Recent Labs Lab 11/22/13 0655  LIPASE 47  AMYLASE 72   No results found for this basename: AMMONIA,  in the last 168 hours CBC:  Recent Labs Lab 11/21/13 2040 11/22/13 0655 11/23/13 0445  WBC 10.2 7.8 8.4  NEUTROABS  --  5.3  --   HGB 13.4 12.4* 12.6*  HCT 37.7* 35.4* 36.8*  MCV 84.7 85.3 86.4  PLT 276 228 240   Cardiac Enzymes: No results found for this basename: CKTOTAL, CKMB, CKMBINDEX, TROPONINI,  in the last 168 hours BNP (last 3 results) No results found for this basename: PROBNP,  in the last 8760 hours CBG: No results found for this basename: GLUCAP,  in the last 168 hours  Recent Results (from the past 240 hour(s))  URINE CULTURE     Status: None   Collection Time    11/22/13  3:08 AM      Result Value Ref Range Status   Specimen Description URINE, RANDOM   Final   Special Requests NONE   Final   Culture  Setup Time  Final   Value: 11/22/2013 13:28     Performed at [facility]   Colony Count     Final   Value: NO GROWTH     [facility]formed at [facility]   Culture     Final   Value: NO GROWTH     Performed at [facility]   Report Status 11/23/2013 FINAL   Final     Studies: Ct Abdomen Pelvis Wo Contrast  11/22/2013   CLINICAL DATA:  Acute abdominal pain and acute renal failure. Recent diarrhea.  EXAM: CT ABDOMEN AND PELVIS WITHOUT CONTRAST  TECHNIQUE: Multidetector CT imaging of the abdomen and pelvis was performed following the standard protocol without intravenous contrast.  COMPARISON:  None.  FINDINGS: There is edema in the mucosa of the distal descending and proximal sigmoid portion of the colon without pericolonic inflammation. There  are numerous diverticula in that area.  The bowel is otherwise normal including the terminal ileum and appendix.  The unenhanced liver, biliary tree, spleen, pancreas, and adrenal glands are normal. There is a 2.2 cm exophytic cyst on the upper pole of the left kidney and a 4 mm cyst on the lateral aspect of the mid portion of the left kidney. Kidneys otherwise appear normal. No hydronephrosis. No ureteral or renal calculi. Bladder is normal. No free air or free fluid. No acute osseous abnormalities.  IMPRESSION: Focal edema in the mucosa of the distal colon consistent with mild colitis. Otherwise benign appearing abdomen.   Electronically Signed   By: Geanie CooleyJim  Maxwell M.D.   On: 11/22/2013 11:14   Dg Chest 2 View  11/22/2013   CLINICAL DATA:  aki  EXAM: CHEST  2 VIEW  COMPARISON:  None.  FINDINGS: The heart size and mediastinal contours are within normal limits. Both lungs are clear. The visualized skeletal structures are unremarkable.  IMPRESSION: No active cardiopulmonary disease.   Electronically Signed   By: Salome HolmesHector  Cooper M.D.   On: 11/22/2013 09:00   [place] Abdomen Complete  11/22/2013   CLINICAL DATA:  Abdominal pain  EXAM: ULTRASOUND ABDOMEN COMPLETE  COMPARISON:  CT scan same day  FINDINGS: Gallbladder:  No gallstones or wall thickening visualized. No sonographic Murphy sign noted.  Common bile duct:  Diameter: 4 mm  Liver:  No focal lesion identified. Within normal limits in parenchymal echogenicity.  IVC:  No abnormality visualized.  Pancreas:  Visualized portion unremarkable.  Spleen:  Size and appearance within normal limits.  Measures 8.5 cm in length  Right Kidney:  Length: 11.3 cm. Echogenicity within normal limits. No mass or hydronephrosis visualized.  Left Kidney:  Length: 12.7 cm. No hydronephrosis or diagnostic renal calculus. Hypoechoic cyst in upper pole measures 2.2 x 2.4 cm.  Abdominal aorta:  No aneurysm visualized.  Measures up to 2.3 cm in diameter.  Other findings:  None.  IMPRESSION:  Unremarkable abdominal ultrasound. No hydronephrosis or diagnostic renal calculus. Left renal cyst again noted.   Electronically Signed   By: Natasha MeadLiviu  Pop M.D.   On: 11/22/2013 15:26    Scheduled Meds: . aspirin EC  81 mg Oral Daily  . chlordiazePOXIDE  25 mg Oral TID AC & HS  . ciprofloxacin  400 mg Intravenous Q24H  . folic acid  1 mg Oral Daily  . heparin  5,000 Units Subcutaneous 3 times per day  . metoprolol tartrate  25 mg Oral BID  . metronidazole  500 mg Intravenous Q8H  . multivitamin with minerals  1 tablet Oral Daily  .  nicotine polacrilex  4 mg Oral BID  . pantoprazole  40 mg Oral Q1200  . sodium chloride  3 mL Intravenous Q12H  . thiamine  100 mg Oral Daily  . traZODone  50 mg Oral QHS   Continuous Infusions: . sodium chloride 150 mL/hr at 11/22/13 1811    Active Problems:   AKI (acute kidney injury)   EtOH dependence   HTN (hypertension)   Benzodiazepine dependence    Time spent: 35    Audel Coakley C  [facility] Pager [phone]. If 7PM-7AM, please contact night-coverage at [email or web address], password [facility] 11/23/2013, 5:09 PM  LOS: 2 days

## 2013-11-24 LAB — DRUGS OF ABUSE SCREEN W/O ALC, ROUTINE URINE
Amphetamine Screen, Ur: NEGATIVE
BARBITURATE QUANT UR: NEGATIVE
BENZODIAZEPINES.: POSITIVE — AB
COCAINE METABOLITES: NEGATIVE
CREATININE, U: 52.8 mg/dL
Marijuana Metabolite: NEGATIVE
Methadone: NEGATIVE
Opiate Screen, Urine: NEGATIVE
PHENCYCLIDINE (PCP): NEGATIVE
Propoxyphene: NEGATIVE

## 2013-11-24 LAB — BASIC METABOLIC PANEL
BUN: 28 mg/dL — ABNORMAL HIGH (ref 6–23)
CO2: 23 mEq/L (ref 19–32)
Calcium: 8.3 mg/dL — ABNORMAL LOW (ref 8.4–10.5)
Chloride: 109 mEq/L (ref 96–112)
Creatinine, Ser: 2.66 mg/dL — ABNORMAL HIGH (ref 0.50–1.35)
GFR, EST AFRICAN AMERICAN: 30 mL/min — AB (ref >90)
GFR, EST NON AFRICAN AMERICAN: 26 mL/min — AB (ref >90)
Glucose, Bld: 89 mg/dL (ref 70–99)
POTASSIUM: 4.1 meq/L (ref 3.7–5.3)
SODIUM: 146 meq/L (ref 137–147)

## 2013-11-24 MED ORDER — METRONIDAZOLE 500 MG PO TABS
500.0000 mg | ORAL_TABLET | Freq: Three times a day (TID) | ORAL | Status: DC
  Administered 2013-11-24 – 2013-11-25 (×4): 500 mg via ORAL
  Filled 2013-11-24 (×7): qty 1

## 2013-11-24 MED ORDER — CIPROFLOXACIN HCL 500 MG PO TABS
500.0000 mg | ORAL_TABLET | Freq: Every day | ORAL | Status: DC
  Administered 2013-11-24 – 2013-11-25 (×2): 500 mg via ORAL
  Filled 2013-11-24 (×2): qty 1

## 2013-11-24 NOTE — Clinical Social Work Note (Cosign Needed)
CSW intern reviewed patient's chart. [facility] came [facility]. CSW intern introduce self to patient. Patient was kind and advised CSW intern that he would be returning to [facility].  Patient has no other needs and was grateful of CSW intern checking on him.    Deniece ReeBrianna Vedansh Kerstetter, CSW Intern.

## 2013-11-24 NOTE — Progress Notes (Signed)
[facility] PROGRESS NOTE  Rick OppenheimDennis Wells ZOX:[id number] DOB: 02/18/1961 DOA: 11/21/2013 PCP: No primary provider on file.  Assessment/Plan: Present on Admission:  . AKI (acute kidney injury) -dehydration in setting of ARB use, contributing factors -improving off ARB and on IVF -Renal ultrasound with left kidney upper pole cyst, otherwise negative -Creatinine 2.66 this a.m., will continue hydration and follow and recheck  . EtOH dependence/benzodiazepine dependence/tobacco abuse -continue ativan prn, MVI thiamine and folic acid -Patient to return to [facility] follow up on discharge, social work to check with [facility] call regarding criteria for return  . HTN (hypertension) -continue metoprolol and prn hydralazine . Colitis, mild -per CT abd, improving clinically, continue Cipro and Flagyl for a total of 7 days.     Code Status:full Family Communication: none at bedside Disposition Plan: to home/[facility] when medically ready   Consultants:  None  Procedures:  none  Antibiotics:  FLAGYL-started 4/5  CIPRO-started 4/5  HPI/Subjective: Patient denies any further diarrhea. States he was told by a [facility] he could only return there when his creatinine is within normal limits. He denies any complaints.  Objective: Filed Vitals:   11/24/13 1350  BP: 134/72  Pulse: 65  Temp: 98.4 F (36.9 C)  Resp: 18    Intake/Output Summary (Last 24 hours) at 11/24/13 1644 Last data filed at 11/24/13 0749  Gross per 24 hour  Intake    720 ml  Output    450 ml  Net    270 ml   Filed Weights   11/22/13 0049 11/22/13 2100 11/23/13 2031  Weight: 96.843 kg (213 lb 8 oz) 96.435 kg (212 lb 9.6 oz) 99.3 kg (218 lb 14.7 oz)    Exam:  General: alert & oriented x 3 In NAD Cardiovascular: RRR, nl S1 s2 Respiratory: CTAB Abdomen: soft +BS NT/ND, no masses palpable Extremities: No cyanosis and no edema    Data Reviewed: Basic Metabolic Panel:  Recent  Labs Lab 11/21/13 2040 11/22/13 0655 11/23/13 0445 11/24/13 0829  NA 139 140 140 146  K 3.8 3.6* 4.4 4.1  CL 96 103 105 109  CO2 24 22 23 23   GLUCOSE 89 103* 91 89  BUN 47* 43* 34* 28*  CREATININE 6.01* 4.88*  4.92* 3.75* 2.66*  CALCIUM 9.2 8.0* 8.2* 8.3*  MG  --  1.5  --   --   PHOS  --  4.8*  --   --    Liver Function Tests:  Recent Labs Lab 11/22/13 0655  AST 13  ALT 15  ALKPHOS 55  BILITOT 0.4  PROT 6.2  ALBUMIN 3.0*    Recent Labs Lab 11/22/13 0655  LIPASE 47  AMYLASE 72   No results found for this basename: AMMONIA,  in the last 168 hours CBC:  Recent Labs Lab 11/21/13 2040 11/22/13 0655 11/23/13 0445  WBC 10.2 7.8 8.4  NEUTROABS  --  5.3  --   HGB 13.4 12.4* 12.6*  HCT 37.7* 35.4* 36.8*  MCV 84.7 85.3 86.4  PLT 276 228 240   Cardiac Enzymes: No results found for this basename: CKTOTAL, CKMB, CKMBINDEX, TROPONINI,  in the last 168 hours BNP (last 3 results) No results found for this basename: PROBNP,  in the last 8760 hours CBG: No results found for this basename: GLUCAP,  in the last 168 hours  Recent Results (from the past 240 hour(s))  URINE CULTURE     Status: None   Collection Time    11/22/13  3:08 AM  Result Value Ref Range Status   Specimen Description URINE, RANDOM   Final   Special Requests NONE   Final   Culture  Setup Time     Final   Value: 11/22/2013 13:28     Performed at [facility]   Colony Count     Final   Value: NO GROWTH     Performed at [facility]   Culture     Final   Value: NO GROWTH     Performed at [facility]   Report Status 11/23/2013 FINAL   Final     Studies: No results found.  Scheduled Meds: . aspirin EC  81 mg Oral Daily  . chlordiazePOXIDE  25 mg Oral TID AC & HS  . ciprofloxacin  500 mg Oral Daily  . folic acid  1 mg Oral Daily  . heparin  5,000 Units Subcutaneous 3 times per day  . metoprolol tartrate  25 mg Oral BID  . metroNIDAZOLE  500 mg Oral 3 times  per day  . multivitamin with minerals  1 tablet Oral Daily  . nicotine polacrilex  4 mg Oral BID  . pantoprazole  40 mg Oral Q1200  . sodium chloride  3 mL Intravenous Q12H  . thiamine  100 mg Oral Daily  . traZODone  50 mg Oral QHS   Continuous Infusions: . sodium chloride 150 mL/hr at 11/24/13 1610    Active Problems:   AKI (acute kidney injury)   EtOH dependence   HTN (hypertension)   Benzodiazepine dependence    Time spent: 25    [facility] C  [facility] Pager [phone]. If 7PM-7AM, please contact night-coverage at [email or web address], password [facility] 11/24/2013, 4:44 PM  LOS: 3 days

## 2013-11-24 NOTE — Consult Note (Signed)
ANTIBIOTIC CONSULT NOTE   Pharmacy Consult for Ciprofloxacin Indication: colitis  No Known Allergies  Patient Measurements: Height: 5\' 10"  (177.8 cm) Weight: 218 lb 14.7 oz (99.3 kg) IBW/kg (Calculated) : 73  Vital Signs: Temp: 98.4 F (36.9 C) (04/07 0524) Temp src: Oral (04/07 0524) BP: 129/79 mmHg (04/07 0524) Pulse Rate: 65 (04/07 0524) Intake/Output from previous day: 04/06 0701 - 04/07 0700 In: 360 [P.O.:360] Out: 501 [Urine:500; Stool:1] Intake/Output from this shift:    Labs:  Recent Labs  11/21/13 2040 11/22/13 0308 11/22/13 0655 11/23/13 0445  WBC 10.2  --  7.8 8.4  HGB 13.4  --  12.4* 12.6*  PLT 276  --  228 240  LABCREA  --  45.89  --   --   CREATININE 6.01*  --  4.88*  4.92* 3.75*   Estimated Creatinine Clearance: 27.2 ml/min (by C-G formula based on Cr of 3.75). No results found for this basename: Rolm GalaVANCOTROUGH, VANCOPEAK, VANCORANDOM, GENTTROUGH, GENTPEAK, GENTRANDOM, TOBRATROUGH, TOBRAPEAK, TOBRARND, AMIKACINPEAK, AMIKACINTROU, AMIKACIN,  in the last 72 hours   Microbiology: Recent Results (from the past 720 hour(s))  URINE CULTURE     Status: None   Collection Time    11/22/13  3:08 AM      Result Value Ref Range Status   Specimen Description URINE, RANDOM   Final   Special Requests NONE   Final   Culture  Setup Time     Final   Value: 11/22/2013 13:28     Performed at [facility]   Colony Count     Final   Value: NO GROWTH     Performed at [facility]   Culture     Final   Value: NO GROWTH     Performed at [facility]   Report Status 11/23/2013 FINAL   Final    Medical History: Past Medical History  Diagnosis Date  . Hypertension   . Sleep apnea   . Anxiety     Medications:  Scheduled:  . aspirin EC  81 mg Oral Daily  . chlordiazePOXIDE  25 mg Oral TID AC & HS  . ciprofloxacin  500 mg Oral Daily  . folic acid  1 mg Oral Daily  . heparin  5,000 Units Subcutaneous 3 times per day  . metoprolol  tartrate  25 mg Oral BID  . metronidazole  500 mg Intravenous Q8H  . multivitamin with minerals  1 tablet Oral Daily  . nicotine polacrilex  4 mg Oral BID  . pantoprazole  40 mg Oral Q1200  . sodium chloride  3 mL Intravenous Q12H  . thiamine  100 mg Oral Daily  . traZODone  50 mg Oral QHS   Assessment: Rick Wells is a 53 yo M admitted with acute kidney injury, cause unknown. He also complains of epigastric abdominal pain, and pharmacy has been consulted to dose ciprofloxacin for colitis. He was also started on Flagyl, managed by the MD.   His WBC are wnl and he remains afebrile.   cipro 4/5 >> Flagyl 4/5 >>  4/5 urine >> ngtd  Goal of Therapy:  Proper antibiotic dosing Eradication of infection  Plan:  - Change Cipro to 500 mg po daily - Follow renal function, cultures, clinical improvement  Talbert CageLora Lakeyta Vandenheuvel, PharmD Clinical Pharmacist Pager: [phone] Pharmacy: [phone] 11/24/2013 8:45 AM

## 2013-11-25 LAB — BASIC METABOLIC PANEL
BUN: 24 mg/dL — ABNORMAL HIGH (ref 6–23)
CHLORIDE: 110 meq/L (ref 96–112)
CO2: 24 mEq/L (ref 19–32)
CREATININE: 2.29 mg/dL — AB (ref 0.50–1.35)
Calcium: 8.8 mg/dL (ref 8.4–10.5)
GFR calc Af Amer: 36 mL/min — ABNORMAL LOW (ref >90)
GFR calc non Af Amer: 31 mL/min — ABNORMAL LOW (ref >90)
Glucose, Bld: 88 mg/dL (ref 70–99)
POTASSIUM: 4.4 meq/L (ref 3.7–5.3)
SODIUM: 147 meq/L (ref 137–147)

## 2013-11-25 LAB — BENZODIAZEPINE, QUANTITATIVE, URINE
ALPRAZOLAMU: 135 ng/mL
Alprazolam (GC/LC/MS), ur confirm: NEGATIVE ng/mL
Clonazepam metabolite (GC/LC/MS), ur confirm: NEGATIVE ng/mL
Diazepam (GC/LC/MS), ur confirm: NEGATIVE ng/mL
ESTAZOLAMU: NEGATIVE ng/mL
FLUNITRAZEPU: NEGATIVE ng/mL
FLURAZEPAM GC/MS CONF: NEGATIVE ng/mL
Lorazepam (GC/LC/MS), ur confirm: NEGATIVE ng/mL
Midazolam (GC/LC/MS), ur confirm: NEGATIVE ng/mL
Nordiazepam GC/MS Conf: NEGATIVE ng/mL
Oxazepam GC/MS Conf: NEGATIVE ng/mL
Temazepam GC/MS Conf: NEGATIVE ng/mL
Triazolam metabolite (GC/LC/MS), ur confirm: NEGATIVE ng/mL

## 2013-11-25 MED ORDER — LORAZEPAM 1 MG PO TABS
2.0000 mg | ORAL_TABLET | Freq: Four times a day (QID) | ORAL | Status: DC | PRN
  Administered 2013-11-25: 2 mg via ORAL
  Filled 2013-11-25: qty 2

## 2013-11-25 MED ORDER — FOLIC ACID 1 MG PO TABS: 1.0000 mg | ORAL_TABLET | Freq: Every day | ORAL | Status: AC

## 2013-11-25 MED ORDER — AMLODIPINE BESYLATE 10 MG PO TABS: 10.0000 mg | ORAL_TABLET | Freq: Every day | ORAL | Status: AC

## 2013-11-25 MED ORDER — CIPROFLOXACIN HCL 500 MG PO TABS: 500.0000 mg | ORAL_TABLET | Freq: Two times a day (BID) | ORAL | Status: AC

## 2013-11-25 MED ORDER — METRONIDAZOLE 500 MG PO TABS: 500.0000 mg | ORAL_TABLET | Freq: Three times a day (TID) | ORAL | Status: AC

## 2013-11-25 NOTE — Progress Notes (Signed)
Patient left floor at approx 0730. Dr. Thedore MinsSingh aware.   Rick Wells Rick Wells

## 2013-11-25 NOTE — Discharge Instructions (Signed)
Follow with Primary MD in 3 days  ° °Get CBC, CMP, checked 3 days by Primary MD and again as instructed by your Primary MD. Get a 2 view Chest X ray done next visit if you had Pneumonia of Lung problems at the Hospital. ° ° °Activity: As tolerated with Full fall precautions use walker/cane & assistance as needed ° ° °Disposition Home   ° ° °Diet: Heart Healthy   ° °For Heart failure patients - Check your Weight same time everyday, if you gain over 2 pounds, or you develop in leg swelling, experience more shortness of breath or chest pain, call your Primary MD immediately. Follow Cardiac Low Salt Diet and 1.8 lit/day fluid restriction. ° ° °On your next visit with her primary care physician please Get Medicines reviewed and adjusted. ° °Please request your Prim.MD to go over all Hospital Tests and Procedure/Radiological results at the follow up, please get all Hospital records sent to your Prim MD by signing hospital release before you go home. ° ° °If you experience worsening of your admission symptoms, develop shortness of breath, life threatening emergency, suicidal or homicidal thoughts you must seek medical attention immediately by calling 911 or calling your MD immediately  if symptoms less severe. ° °You Must read complete instructions/literature along with all the possible adverse reactions/side effects for all the Medicines you take and that have been prescribed to you. Take any new Medicines after you have completely understood and accpet all the possible adverse reactions/side effects.  ° °Do not drive and provide baby sitting services if your were admitted for syncope or siezures until you have seen by Primary MD or a Neurologist and advised to do so again. ° °Do not drive when taking Pain medications.  ° ° °Do not take more than prescribed Pain, Sleep and Anxiety Medications ° °Special Instructions: If you have smoked or chewed Tobacco  in the last 2 yrs please stop smoking, stop any regular Alcohol  and  or any Recreational drug use. ° °Wear Seat belts while driving. ° ° °Please note ° °You were cared for by a hospitalist during your hospital stay. If you have any questions about your discharge medications or the care you received while you were in the hospital after you are discharged, you can call the unit and asked to speak with the hospitalist on call if the hospitalist that took care of you is not available. Once you are discharged, your primary care physician will handle any further medical issues. Please note that NO REFILLS for any discharge medications will be authorized once you are discharged, as it is imperative that you return to your primary care physician (or establish a relationship with a primary care physician if you do not have one) for your aftercare needs so that they can reassess your need for medications and monitor your lab values. ° °

## 2013-11-25 NOTE — Discharge Summary (Signed)
Rick Wells, is a 53 y.o. male  DOB 06-02-1961  MRN [id number].  Admission date:  11/21/2013  Admitting Physician  Conley Canal, MD  Discharge Date:  11/25/2013   Primary MD  Dede Query, MD  Recommendations for primary care physician for things to follow:   Repeat CBC and BMP in 3-7 days  Outpatient renal and GI followup recommended.  Kindly monitor that patient continues to follow with his detox center   Admission Diagnosis  ARF (acute renal failure) [584.9]   Discharge Diagnosis  ARF (acute renal failure) [584.9]  due to prerenal Azotemia  Active Problems:   AKI (acute kidney injury)   EtOH dependence   HTN (hypertension)   Benzodiazepine dependence      Past Medical History  Diagnosis Date  . Hypertension   . Sleep apnea   . Anxiety     Past Surgical History  Procedure Laterality Date  . Wrist fusion    . Joint replacement      knee left     Discharge Condition: stable   Follow UP  Follow-up Information   Follow up with NEWMAN,LEWIS. Schedule an appointment as soon as possible for a visit in 3 days.      Follow up with Dagoberto Ligas., MD. Schedule an appointment as soon as possible for a visit in 1 week.   Specialty:  Nephrology   Contact information:   [address] [facility] [place] [address] [phone]         Discharge Instructions  and  Discharge Medications          Discharge Orders   Future Orders Complete By Expires   Diet - low sodium heart healthy  As directed    Discharge instructions  As directed    Increase activity slowly  As directed        Medication List    STOP taking these medications       losartan-hydrochlorothiazide 100-12.5 MG per tablet  Commonly known as:  HYZAAR      TAKE these medications       amLODipine 10 MG tablet  Commonly  known as:  NORVASC  Take 1 tablet (10 mg total) by mouth daily.     chlordiazePOXIDE 25 MG capsule  Commonly known as:  LIBRIUM  Take 25 mg by mouth 4 (four) times daily.     ciprofloxacin 500 MG tablet  Commonly known as:  CIPRO  Take 1 tablet (500 mg total) by mouth 2 (two) times daily.     Fish Oil 1200 MG Caps  Take 1,200 mg by mouth 2 (two) times daily.     folic acid 1 MG tablet  Commonly known as:  FOLVITE  Take 1 tablet (1 mg total) by mouth daily.     metroNIDAZOLE 500 MG tablet  Commonly known as:  FLAGYL  Take 1 tablet (500 mg total) by mouth 3 (three) times daily.     multivitamin with minerals Tabs tablet  Take 1 tablet by mouth daily.  nicotine polacrilex 4 MG gum  Commonly known as:  NICORETTE  Take 4 mg by mouth 2 (two) times daily.     PSYLLIUM HUSK PO  Take 1,000 mg by mouth 2 (two) times daily.     thiamine 100 MG tablet  Commonly known as:  VITAMIN B-1  Take 100 mg by mouth daily.     traZODone 50 MG tablet  Commonly known as:  DESYREL  Take 50 mg by mouth at bedtime.          Diet and Activity recommendation: See Discharge Instructions above   Consults obtained -     Major procedures and Radiology Reports - PLEASE review detailed and final reports for all details, in brief -       Ct Abdomen Pelvis Wo Contrast  11/22/2013   CLINICAL DATA:  Acute abdominal pain and acute renal failure. Recent diarrhea.  EXAM: CT ABDOMEN AND PELVIS WITHOUT CONTRAST  TECHNIQUE: Multidetector CT imaging of the abdomen and pelvis was performed following the standard protocol without intravenous contrast.  COMPARISON:  None.  FINDINGS: There is edema in the mucosa of the distal descending and proximal sigmoid portion of the colon without pericolonic inflammation. There are numerous diverticula in that area.  The bowel is otherwise normal including the terminal ileum and appendix.  The unenhanced liver, biliary tree, spleen, pancreas, and adrenal glands are  normal. There is a 2.2 cm exophytic cyst on the upper pole of the left kidney and a 4 mm cyst on the lateral aspect of the mid portion of the left kidney. Kidneys otherwise appear normal. No hydronephrosis. No ureteral or renal calculi. Bladder is normal. No free air or free fluid. No acute osseous abnormalities.  IMPRESSION: Focal edema in the mucosa of the distal colon consistent with mild colitis. Otherwise benign appearing abdomen.   Electronically Signed   By: Geanie Cooley M.D.   On: 11/22/2013 11:14   Dg Chest 2 View  11/22/2013   CLINICAL DATA:  aki  EXAM: CHEST  2 VIEW  COMPARISON:  None.  FINDINGS: The heart size and mediastinal contours are within normal limits. Both lungs are clear. The visualized skeletal structures are unremarkable.  IMPRESSION: No active cardiopulmonary disease.   Electronically Signed   By: Salome Holmes M.D.   On: 11/22/2013 09:00   US Abdomen Complete  11/22/2013   CLINICAL DATA:  Abdominal pain  EXAM: ULTRASOUND ABDOMEN COMPLETE  COMPARISON:  CT scan same day  FINDINGS: Gallbladder:  No gallstones or wall thickening visualized. No sonographic Murphy sign noted.  Common bile duct:  Diameter: 4 mm  Liver:  No focal lesion identified. Within normal limits in parenchymal echogenicity.  IVC:  No abnormality visualized.  Pancreas:  Visualized portion unremarkable.  Spleen:  Size and appearance within normal limits.  Measures 8.5 cm in length  Right Kidney:  Length: 11.3 cm. Echogenicity within normal limits. No mass or hydronephrosis visualized.  Left Kidney:  Length: 12.7 cm. No hydronephrosis or diagnostic renal calculus. Hypoechoic cyst in upper pole measures 2.2 x 2.4 cm.  Abdominal aorta:  No aneurysm visualized.  Measures up to 2.3 cm in diameter.  Other findings:  None.  IMPRESSION: Unremarkable abdominal ultrasound. No hydronephrosis or diagnostic renal calculus. Left renal cyst again noted.   Electronically Signed   By: Natasha Mead M.D.   On: 11/22/2013 15:26    Micro  Results      Recent Results (from the past 240 hour(s))  URINE CULTURE  Status: None   Collection Time    11/22/13  3:08 AM      Result Value Ref Range Status   Specimen Description URINE, RANDOM   Final   Special Requests NONE   Final   Culture  Setup Time     Final   Value: 11/22/2013 13:28     [facility]formed at [facility]   Colony Count     Final   Value: NO GROWTH     [facility] at [facility]   Culture     Final   Value: NO GROWTH     Performed at [facility]   Report Status 11/23/2013 FINAL   Final     History of present illness and  Hospital Course:     Kindly see H&P for history of present illness and admission details, please review complete Labs, Consult reports and Test reports for all details in brief Rick Wells, is a 53 y.o. male, patient with history of  essential hypertension, alcohol, tobacco and benzodiazepine addiction undergoing detox treatment, brought in her for elevated creatinine caused by acute renal failure secondary to prerenal azotemia caused by gastroenteritis induced diarrhea with combination of ACE inhibitor and diuretic which he was on for his hypertension.   He underwent CT scan of the abdomen and pelvis along with renal ultrasound both of which were unremarkable except for mild colitis which was consistent with his history of gastroenteritis, he was placed on Cipro Flagyl with good effect, received IV fluids, offending ACE inhibitor and diuretic cognition was held. Creatinine peaked at 6-1/2 and is down to 2.2 with good urine output. He will be discharged home on 5 more days of Cipro Flagyl for colitis, will replace his ACE inhibitor diuretic cognition with Norvasc for blood pressure control.     He'll follow in the outpatient setting with PCP in 3-7 days for repeat CBC BMP, one time outpatient renal followup is recommended. He must follow with GI in the outpatient setting for routine colonoscopy as is above 50 this has  been suggested to the patient and I will request PCP to followup on this.     For his ongoing detox he is motivated to follow in the outpatient setting, I have counseled him to continue abstaining from offending agents which include alcohol, cigarettes and benzodiazepines. No signs of active withdrawal whatsoever.    Today   Subjective:   Rick Wells today has no headache,no chest abdominal pain,no new weakness tingling or numbness, feels much better wants to go home today.    Objective:   Blood pressure 134/88, pulse 62, temperature 98.5 F (36.9 C), temperature source Oral, resp. rate 18, height 5\' 10"  (1.778 m), weight 99 kg (218 lb 4.1 oz), SpO2 98.00%.   Intake/Output Summary (Last 24 hours) at 11/25/13 1034 Last data filed at 11/24/13 1901  Gross per 24 hour  Intake    240 ml  Output    500 ml  Net   -260 ml    Exam Awake Alert, Oriented *3, No new F.N deficits, Normal affect [place].AT,PERRAL Supple Neck,No JVD, No cervical lymphadenopathy appriciated.  Symmetrical Chest wall movement, Good air movement bilaterally, CTAB RRR,No Gallops,Rubs or new Murmurs, No Parasternal Heave +ve B.Sounds, Abd Soft, Non tender, No organomegaly appriciated, No rebound -guarding or rigidity. No Cyanosis, Clubbing or edema, No new Rash or bruise  Data Review   CBC w Diff: Lab Results  Component Value Date   WBC 8.4 11/23/2013   HGB  12.6* 11/23/2013   HCT 36.8* 11/23/2013   PLT 240 11/23/2013   LYMPHOPCT 19 11/22/2013   MONOPCT 9 11/22/2013   EOSPCT 2 11/22/2013   BASOPCT 0 11/22/2013    CMP: Lab Results  Component Value Date   NA 147 11/25/2013   K 4.4 11/25/2013   CL 110 11/25/2013   CO2 24 11/25/2013   BUN 24* 11/25/2013   CREATININE 2.29* 11/25/2013   PROT 6.2 11/22/2013   ALBUMIN 3.0* 11/22/2013   BILITOT 0.4 11/22/2013   ALKPHOS 55 11/22/2013   AST 13 11/22/2013   ALT 15 11/22/2013  .   Total Time in preparing paper work, data evaluation and todays exam - 35 minutes  Leroy Sea M.D on  11/25/2013 at 10:34 AM  [facility] Office  [phone]

## 2015-06-21 DEATH — deceased

## 2015-12-14 IMAGING — CT CT ABD-PELV W/O CM
2 of 4 series · 17 of 46 positions shown, 19 images · non-contrast
Comparison: None.

CLINICAL DATA: Acute abdominal pain and acute renal failure. Recent
diarrhea.

EXAM:
CT ABDOMEN AND PELVIS WITHOUT CONTRAST
TECHNIQUE: Multidetector CT imaging of the abdomen and pelvis was performed
following the standard protocol without intravenous contrast.

[Series 2: routine · axial · 0.73mm/px · z∈[+100,+540]mm · 14 of 96 slices shown, 16 images]
[im 4/96  soft-tissue]
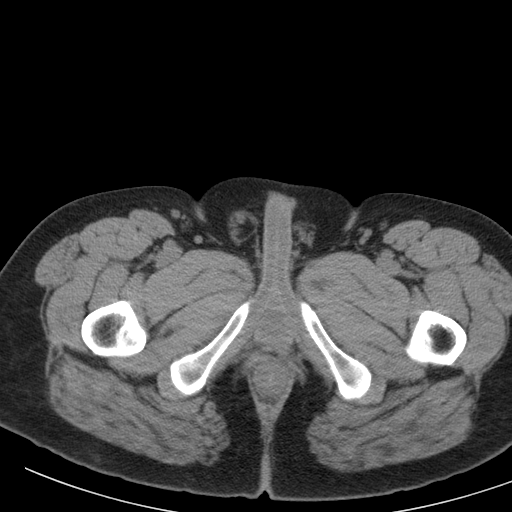
[im 4/96  bone]
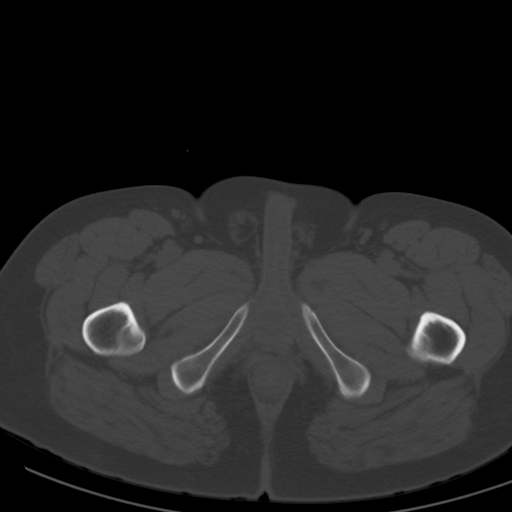
[im 12/96  soft-tissue]
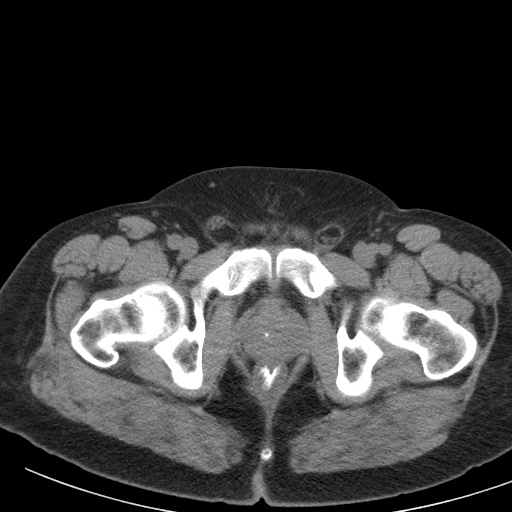
[im 20/96  soft-tissue]
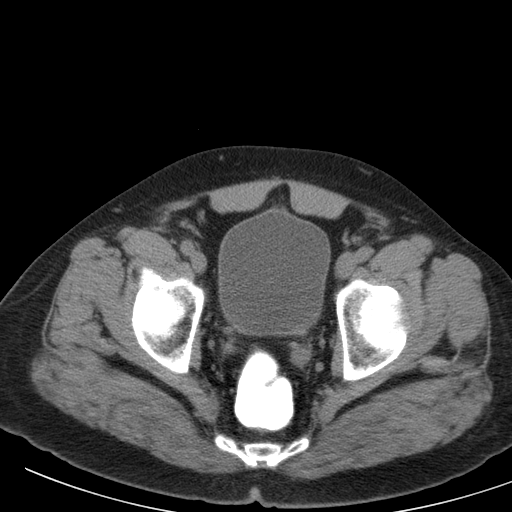
[im 24/96  soft-tissue]
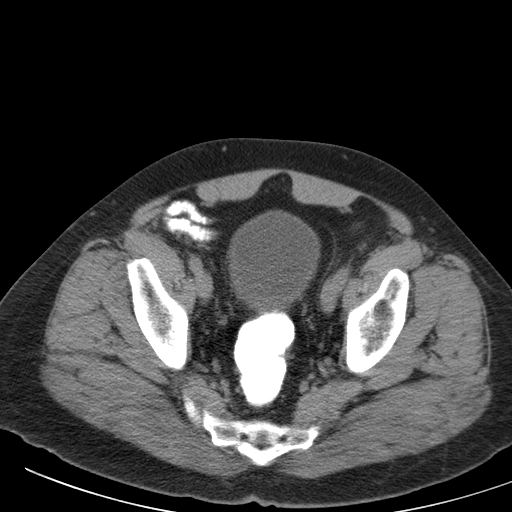
[im 32/96  soft-tissue]
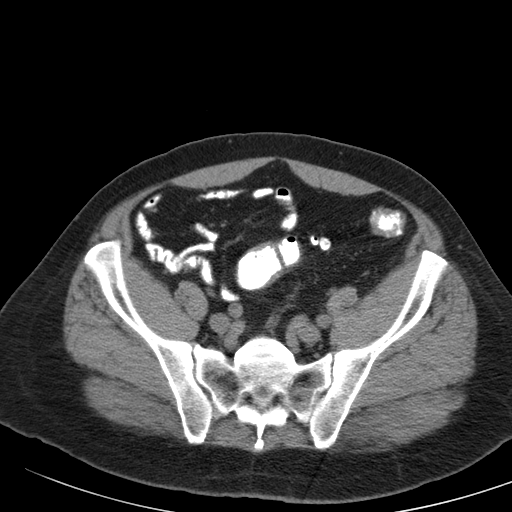
[im 40/96  soft-tissue]
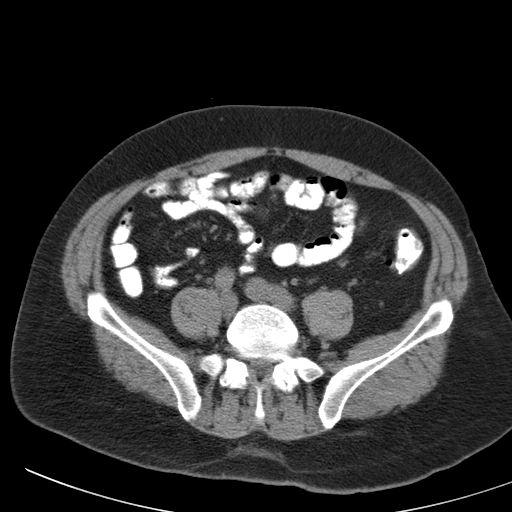
[im 44/96  soft-tissue]
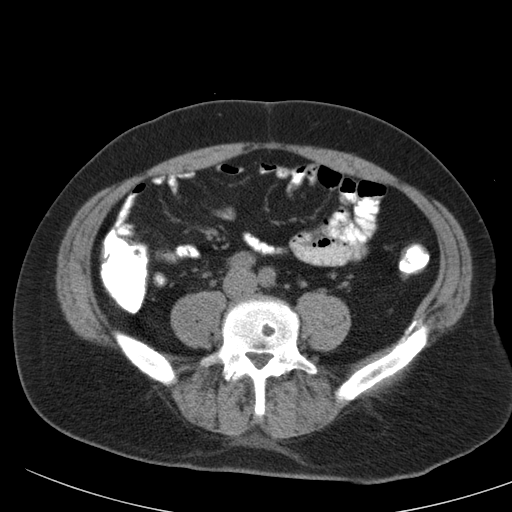
[im 52/96  soft-tissue]
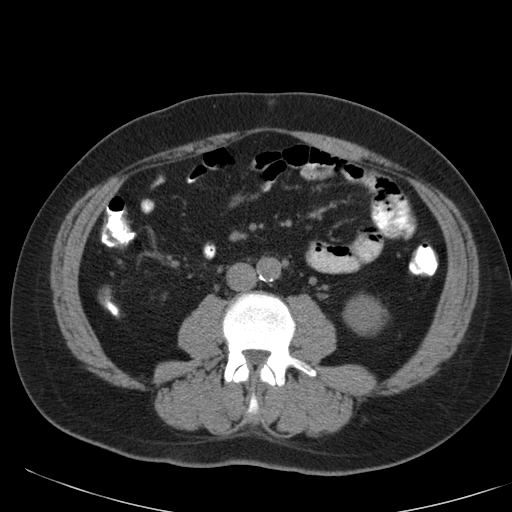
[im 56/96  soft-tissue]
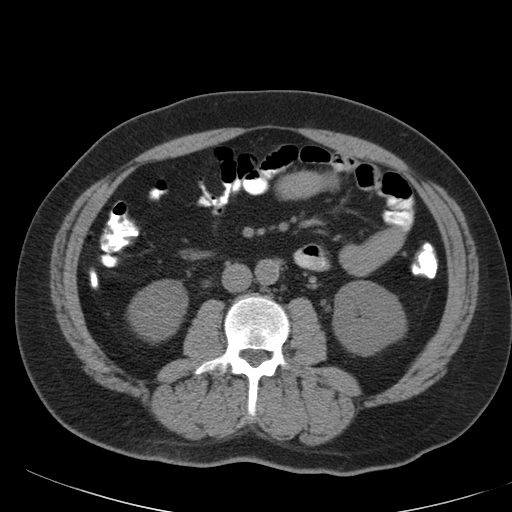
[im 56/96  bone]
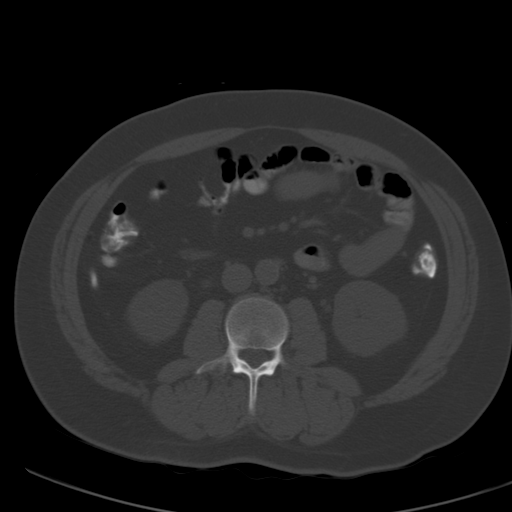
[im 64/96  soft-tissue]
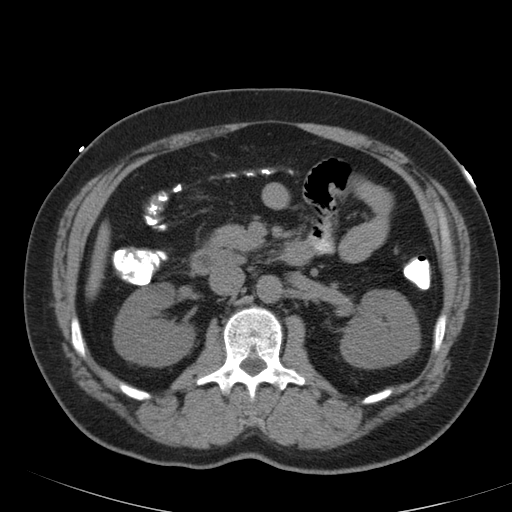
[im 72/96  soft-tissue]
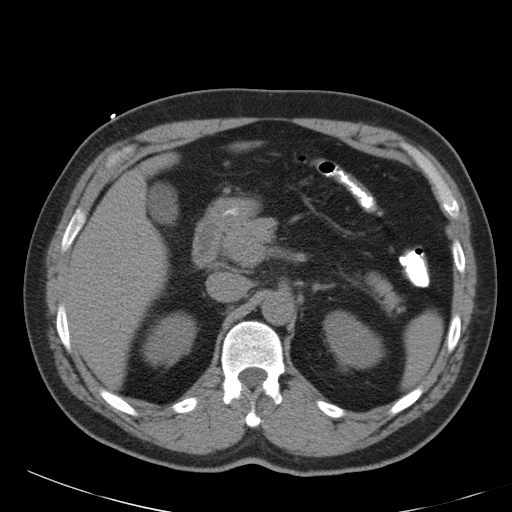
[im 76/96  soft-tissue]
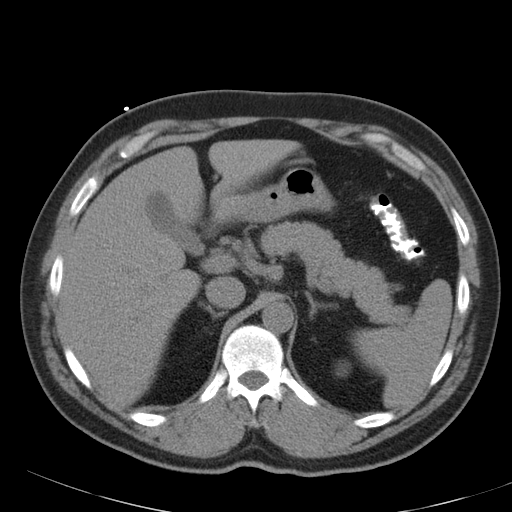
[im 84/96  soft-tissue]
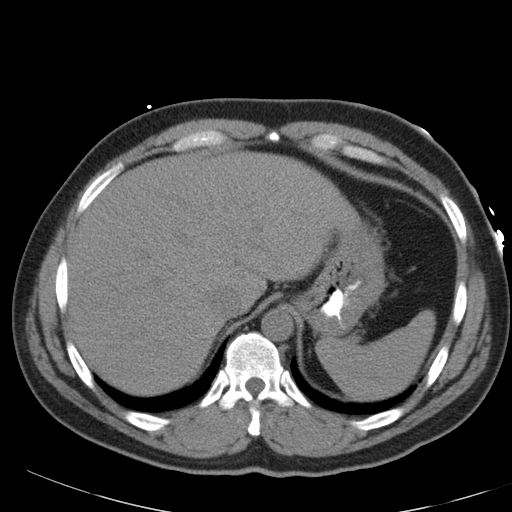
[im 92/96  soft-tissue]
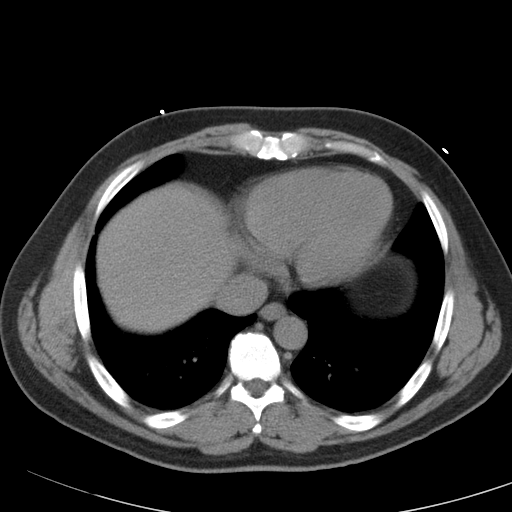

[coronals · coronal · 0.93mm/px · 3 of 136 slices shown]
[im 46/136  soft-tissue]
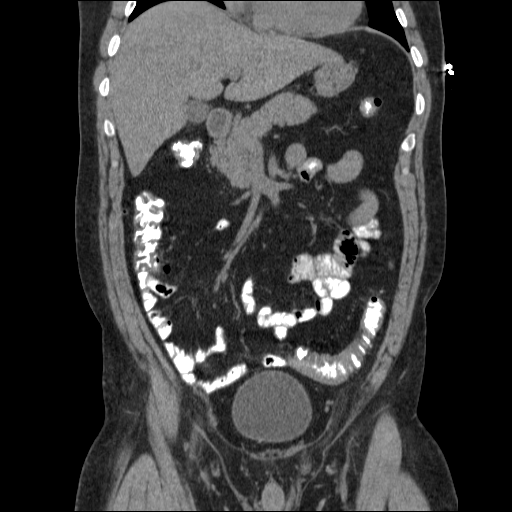
[im 61/136  soft-tissue]
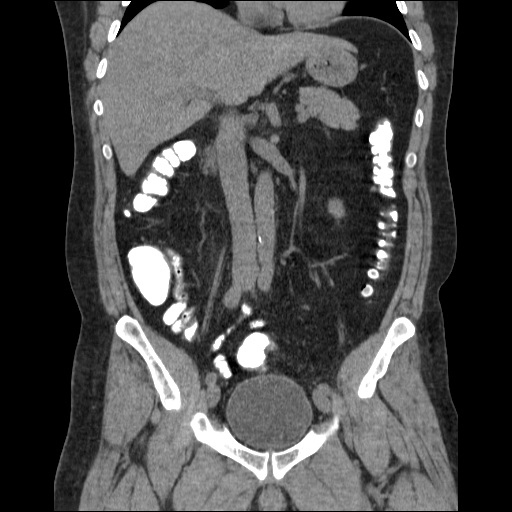
[im 76/136  soft-tissue]
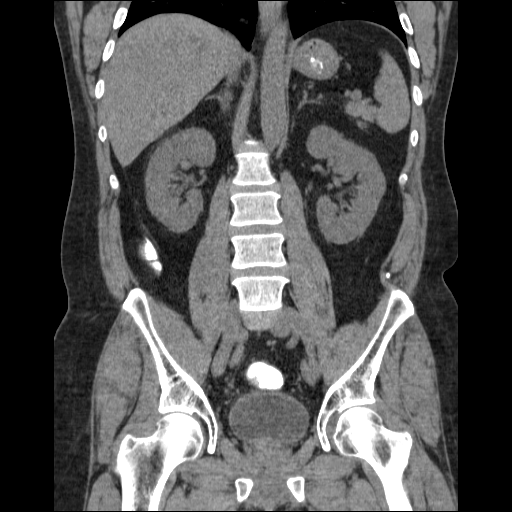

[17 of 46 positions shown; findings below may reference images not displayed]

FINDINGS: There is edema in the mucosa of the distal descending and proximal
sigmoid portion of the colon without pericolonic inflammation. There
are numerous diverticula in that area.

The bowel is otherwise normal including the terminal ileum and
appendix.

The unenhanced liver, biliary tree, spleen, pancreas, and adrenal
glands are normal. There is a 2.2 cm exophytic cyst on the upper
pole of the left kidney and a 4 mm cyst on the lateral aspect of the
mid portion of the left kidney. Kidneys otherwise appear normal. No
hydronephrosis. No ureteral or renal calculi. Bladder is normal. No
free air or free fluid. No acute osseous abnormalities.
IMPRESSION: Focal edema in the mucosa of the distal colon consistent with mild
colitis. Otherwise benign appearing abdomen.
